# Patient Record
Sex: Female | Born: 1955 | Hispanic: No | Marital: Married | Smoking: Never smoker
Health system: Southern US, Community
[De-identification: ages and names within clinical notes are randomized; demographics above are authoritative.]

## PROBLEM LIST (undated history)

## (undated) DIAGNOSIS — I10 Essential (primary) hypertension: Secondary | ICD-10-CM

## (undated) DIAGNOSIS — E119 Type 2 diabetes mellitus without complications: Secondary | ICD-10-CM

---

## 2016-05-03 ENCOUNTER — Emergency Department (HOSPITAL_BASED_OUTPATIENT_CLINIC_OR_DEPARTMENT_OTHER): Payer: BLUE CROSS/BLUE SHIELD

## 2016-05-03 ENCOUNTER — Encounter (HOSPITAL_BASED_OUTPATIENT_CLINIC_OR_DEPARTMENT_OTHER): Payer: Self-pay

## 2016-05-03 ENCOUNTER — Observation Stay (HOSPITAL_BASED_OUTPATIENT_CLINIC_OR_DEPARTMENT_OTHER)
Admission: EM | Admit: 2016-05-03 | Discharge: 2016-05-05 | Disposition: A | Discharge status: Home / Self Care | Payer: BLUE CROSS/BLUE SHIELD | Attending: Internal Medicine | Admitting: Internal Medicine

## 2016-05-03 DIAGNOSIS — R51 Headache: Secondary | ICD-10-CM

## 2016-05-03 DIAGNOSIS — I10 Essential (primary) hypertension: Secondary | ICD-10-CM | POA: Diagnosis present

## 2016-05-03 DIAGNOSIS — IMO0002 Reserved for concepts with insufficient information to code with codable children: Secondary | ICD-10-CM | POA: Insufficient documentation

## 2016-05-03 DIAGNOSIS — E1165 Type 2 diabetes mellitus with hyperglycemia: Secondary | ICD-10-CM | POA: Insufficient documentation

## 2016-05-03 DIAGNOSIS — Z7984 Long term (current) use of oral hypoglycemic drugs: Secondary | ICD-10-CM | POA: Insufficient documentation

## 2016-05-03 DIAGNOSIS — E785 Hyperlipidemia, unspecified: Secondary | ICD-10-CM | POA: Diagnosis not present

## 2016-05-03 DIAGNOSIS — D509 Iron deficiency anemia, unspecified: Secondary | ICD-10-CM | POA: Insufficient documentation

## 2016-05-03 DIAGNOSIS — D519 Vitamin B12 deficiency anemia, unspecified: Secondary | ICD-10-CM | POA: Diagnosis not present

## 2016-05-03 DIAGNOSIS — N390 Urinary tract infection, site not specified: Secondary | ICD-10-CM

## 2016-05-03 DIAGNOSIS — E1142 Type 2 diabetes mellitus with diabetic polyneuropathy: Secondary | ICD-10-CM | POA: Insufficient documentation

## 2016-05-03 DIAGNOSIS — I160 Hypertensive urgency: Principal | ICD-10-CM | POA: Insufficient documentation

## 2016-05-03 DIAGNOSIS — T502X5A Adverse effect of carbonic-anhydrase inhibitors, benzothiadiazides and other diuretics, initial encounter: Secondary | ICD-10-CM | POA: Insufficient documentation

## 2016-05-03 DIAGNOSIS — R8290 Unspecified abnormal findings in urine: Secondary | ICD-10-CM | POA: Diagnosis present

## 2016-05-03 DIAGNOSIS — R519 Headache, unspecified: Secondary | ICD-10-CM

## 2016-05-03 DIAGNOSIS — I6783 Posterior reversible encephalopathy syndrome: Secondary | ICD-10-CM

## 2016-05-03 DIAGNOSIS — E871 Hypo-osmolality and hyponatremia: Secondary | ICD-10-CM | POA: Diagnosis not present

## 2016-05-03 DIAGNOSIS — Z79899 Other long term (current) drug therapy: Secondary | ICD-10-CM | POA: Insufficient documentation

## 2016-05-03 HISTORY — DX: Type 2 diabetes mellitus without complications: E11.9

## 2016-05-03 HISTORY — DX: Essential (primary) hypertension: I10

## 2016-05-03 MED ORDER — AMLODIPINE BESYLATE 5 MG PO TABS
5.0000 mg | ORAL_TABLET | Freq: Once | ORAL | Status: AC
  Administered 2016-05-03: 5 mg via ORAL
  Filled 2016-05-03: qty 1

## 2016-05-03 NOTE — ED Provider Notes (Signed)
[facility] Provider Note   CSN: [id number] Arrival date & time: 05/03/16  2005  First Provider Contact:   First MD Initiated Contact with Patient 05/03/16 2338      By signing my name below, I, [name], attest that this documentation has been prepared under the direction and in the presence of Andrea Colglazier, MD. Electronically Signed: [name], ED Scribe. 05/03/16. 11:45 PM.    History   Chief Complaint Chief Complaint  Patient presents with  . Hypertension    HPI  Martha Webster is a 60 y.o. female with a  PMHx of HTN and DM, who presents to the Emergency Department complaining of HTN onset 2 days. Pt reports that she was diagnosed with HTN 15 years ago. Pt relative states that the pt is from [place] and she has been in [place] since 6/31/2017 and staying until 06/12/2016. Pt son states that the when the pt is in [place] she is typically given norvasc every couple months when her pressure is elevated. Pt reports that her baseline blood pressure is 130/70. Pt takes ARB/HCTZ and Nebivolol for her HTN.  She states that she is having associated symptoms of HA. She states that she has not tried any medications for the relief for her symptoms. She denies any other symptoms. Pt states that her blood sugar is typically around 130 range. Denies allergies to medications. Pt takes vitamin D and zinc supplement, lipiget, tagipimet. Co-Q 10, and generic version of lyrica daily.   The history is provided by the patient and a relative. A language interpreter was used.  Hypertension  This is a recurrent problem. The current episode started 2 days ago. The problem occurs constantly. The problem has not changed since onset.Associated symptoms include headaches. Pertinent negatives include no chest pain, no abdominal pain and no shortness of breath. Nothing aggravates the symptoms. Nothing relieves the symptoms. She has tried nothing for the symptoms. The treatment provided no relief.     Past Medical History:  Diagnosis Date  . Diabetes mellitus without complication (HCC)   . Hypertension     There are no active problems to display for this patient.   History reviewed. No pertinent surgical history.  OB History    No data available       Home Medications    Prior to Admission medications   Medication Sig Start Date End Date Taking? Authorizing Provider  candesartan-hydrochlorothiazide (ATACAND HCT) 16-12.5 MG tablet Take 1 tablet by mouth daily.   Yes Historical Provider, MD  nebivolol (BYSTOLIC) 5 MG tablet Take 5 mg by mouth daily.   Yes Historical Provider, MD    Family History No family history on file.  Social History Social History  Substance Use Topics  . Smoking status: Never Smoker  . Smokeless tobacco: Never Used  . Alcohol use No     Allergies   Other   Review of Systems Review of Systems  Respiratory: Negative for shortness of breath.   Cardiovascular: Negative for chest pain.  Gastrointestinal: Negative for abdominal pain.  Neurological: Positive for headaches. Negative for tremors, seizures, syncope, facial asymmetry, speech difficulty, weakness, light-headedness and numbness.  All other systems reviewed and are negative.    Physical Exam Updated Vital Signs BP 173/77 (BP Location: Right Arm)   Pulse 84   Temp 98.4 F (36.9 C) (Oral)   Resp 18   Ht 5\' 3"  (1.6 m)   Wt 145 lb 8.1 oz (66 kg)   SpO2 99%  BMI 25.77 kg/m   Physical Exam  Constitutional: She appears well-developed and well-nourished. No distress.  HENT:  Head: Normocephalic and atraumatic.  Mouth/Throat: Oropharynx is clear and moist. No oropharyngeal exudate.  Eyes: Conjunctivae and EOM are normal. Pupils are equal, round, and reactive to light.  Neck: Normal range of motion. Neck supple. Carotid bruit is not present. No tracheal deviation present.  Cardiovascular: Normal rate, regular rhythm and normal heart sounds.  Exam reveals no gallop and no  friction rub.   No murmur heard. Pulmonary/Chest: Effort normal and breath sounds normal. No stridor. No respiratory distress. She has no wheezes. She has no rales.  Abdominal: Soft. Bowel sounds are normal. She exhibits no distension and no mass. There is no tenderness. There is no rebound and no guarding.  Musculoskeletal: Normal range of motion. She exhibits no edema.  Neurological: She is alert. She has normal reflexes. No cranial nerve deficit.  Cranial nerves 2-12 intact.  Skin: Skin is warm and dry. Capillary refill takes less than 2 seconds.  Psychiatric: She has a normal mood and affect. Her behavior is normal.  Nursing note and vitals reviewed.    ED Treatments / Results  DIAGNOSTIC STUDIES: Oxygen Saturation is 99% on RA, nl by my interpretation.    COORDINATION OF CARE: 11:45 PM Discussed treatment plan with pt at bedside which includes UA, labs, CT head, and norvasc and pt agreed to plan.   Labs (all labs ordered are listed, but only abnormal results are displayed) Labs Reviewed  BASIC METABOLIC PANEL  URINALYSIS, ROUTINE W REFLEX MICROSCOPIC (NOT AT [facility])    EKG  EKG Interpretation None       Radiology No results found.  Procedures Procedures (including critical care time)  Medications Ordered in ED Medications  amLODipine (NORVASC) tablet 5 mg (5 mg Oral Given 05/03/16 2359)     Initial Impression / Assessment and Plan / ED Course  I have reviewed the triage vital signs and the nursing notes.  Pertinent labs & imaging results that were available during my care of the patient were reviewed by me and considered in my medical decision making (see chart for details).  Clinical Course   Vitals:   05/04/16 0337 05/04/16 0436  BP: 181/85 162/80  Pulse: 96 90  Resp: 18 22  Temp:  97.9 F (36.6 C)   Results for orders placed or performed during the hospital encounter of 05/03/16  Basic metabolic panel  Result Value Ref Range   Sodium 125 (L) 135 -  145 mmol/L   Potassium 4.5 3.5 - 5.1 mmol/L   Chloride 91 (L) 101 - 111 mmol/L   CO2 24 22 - 32 mmol/L   Glucose, Bld 211 (H) 65 - 99 mg/dL   BUN 11 6 - 20 mg/dL   Creatinine, Ser 1.61 0.44 - 1.00 mg/dL   Calcium 9.4 8.9 - 09.6 mg/dL   GFR calc non Af Amer >60 >60 mL/min   GFR calc Af Amer >60 >60 mL/min   Anion gap 10 5 - 15  Urinalysis, Routine w reflex microscopic (not at [facility])  Result Value Ref Range   Color, Urine YELLOW YELLOW   APPearance CLEAR CLEAR   Specific Gravity, Urine 1.010 1.005 - 1.030   pH 7.5 5.0 - 8.0   Glucose, UA NEGATIVE NEGATIVE mg/dL   Hgb urine dipstick NEGATIVE NEGATIVE   Bilirubin Urine NEGATIVE NEGATIVE   Ketones, ur NEGATIVE NEGATIVE mg/dL   Protein, ur NEGATIVE NEGATIVE mg/dL   Nitrite  NEGATIVE NEGATIVE   Leukocytes, UA LARGE (A) NEGATIVE  Urine microscopic-add on  Result Value Ref Range   Squamous Epithelial / LPF 6-30 (A) NONE SEEN   WBC, UA 6-30 0 - 5 WBC/hpf   RBC / HPF NONE SEEN 0 - 5 RBC/hpf   Bacteria, UA FEW (A) NONE SEEN  POC CBG, ED  Result Value Ref Range   Glucose-Capillary 226 (H) 65 - 99 mg/dL   Ct Head Wo Contrast  Result Date: 05/04/2016 CLINICAL DATA:  Biparietal headache and head pressure. Uncontrolled hypertension for 2 days. EXAM: CT HEAD WITHOUT CONTRAST TECHNIQUE: Contiguous axial images were obtained from the base of the skull through the vertex without intravenous contrast. COMPARISON:  None. FINDINGS: Brain: Questionable low-attenuation in the posterior occipital lobes bilaterally. No intracranial hemorrhage or midline shift. No hydrocephalus. The basilar cisterns are patent. No evidence of territorial infarct. No intracranial fluid collection. Vascular: No hyperdense vessel or abnormal calcification. Skull: Negative for fracture or focal lesion. Calvarium is intact. Sinuses/Orbits: No acute findings. Minimal bubbly debris in the sphenoid sinus. The mastoid air cells are well aerated. Other: None. IMPRESSION: Equivocal  diminished density involving the posterior occipital lobes in a symmetric pattern. Cannot exclude posterior reversible encephalopathy syndrome (PRES) in the setting of hypertension. If clinically warranted this could be further evaluated with MRI. No hemorrhage.  There is otherwise no acute abnormality. Electronically Signed   By: Rubye Oaks M.D.   On: 05/04/2016 01:29   Medications  0.9 %  sodium chloride infusion (1,000 mLs Intravenous New Bag/Given 05/04/16 0415)  amLODipine (NORVASC) tablet 5 mg (5 mg Oral Given 05/03/16 2359)  cefTRIAXone (ROCEPHIN) 1 g in dextrose 5 % 50 mL IVPB (0 g Intravenous Stopped 05/04/16 0435)     Final Clinical Impressions(s) / ED Diagnoses   Final diagnoses:  None    New Prescriptions New Prescriptions   No medications on file  case d/w Dr. Roseanne Reno admit to medicine for work up Tele obs per Dr. Toniann Fail I personally performed the services described in this documentation, which was scribed in my presence. The recorded information has been reviewed and is accurate.       Cy Blamer, MD 05/04/16 [phone]

## 2016-05-03 NOTE — ED Triage Notes (Signed)
Per son pt with high blood pressure, HA x 2 days-pt 's son interpreting-NAD-steady gait

## 2016-05-04 ENCOUNTER — Observation Stay (HOSPITAL_BASED_OUTPATIENT_CLINIC_OR_DEPARTMENT_OTHER): Payer: BLUE CROSS/BLUE SHIELD

## 2016-05-04 ENCOUNTER — Observation Stay (HOSPITAL_COMMUNITY): Payer: BLUE CROSS/BLUE SHIELD

## 2016-05-04 ENCOUNTER — Encounter (HOSPITAL_BASED_OUTPATIENT_CLINIC_OR_DEPARTMENT_OTHER): Payer: Self-pay | Admitting: Emergency Medicine

## 2016-05-04 DIAGNOSIS — I10 Essential (primary) hypertension: Secondary | ICD-10-CM | POA: Diagnosis present

## 2016-05-04 DIAGNOSIS — R519 Headache, unspecified: Secondary | ICD-10-CM

## 2016-05-04 DIAGNOSIS — R51 Headache: Secondary | ICD-10-CM

## 2016-05-04 DIAGNOSIS — E1142 Type 2 diabetes mellitus with diabetic polyneuropathy: Secondary | ICD-10-CM | POA: Diagnosis present

## 2016-05-04 DIAGNOSIS — R8290 Unspecified abnormal findings in urine: Secondary | ICD-10-CM | POA: Diagnosis present

## 2016-05-04 DIAGNOSIS — N390 Urinary tract infection, site not specified: Secondary | ICD-10-CM

## 2016-05-04 DIAGNOSIS — E871 Hypo-osmolality and hyponatremia: Secondary | ICD-10-CM | POA: Diagnosis present

## 2016-05-04 DIAGNOSIS — IMO0002 Reserved for concepts with insufficient information to code with codable children: Secondary | ICD-10-CM | POA: Insufficient documentation

## 2016-05-04 DIAGNOSIS — E1165 Type 2 diabetes mellitus with hyperglycemia: Secondary | ICD-10-CM | POA: Insufficient documentation

## 2016-05-04 DIAGNOSIS — E785 Hyperlipidemia, unspecified: Secondary | ICD-10-CM | POA: Diagnosis present

## 2016-05-04 LAB — ECHOCARDIOGRAM COMPLETE
E/e' ratio: 9.59
FS: 42 % (ref 28–44)
Height: 63 in
IV/PV OW: 0.88
LA diam index: 1.95 cm/m2
LA vol A4C: 25.7 ml
LASIZE: 33 mm
LAVOL: 33.6 mL
LAVOLIN: 19.9 mL/m2
LEFT ATRIUM END SYS DIAM: 33 mm
LV PW d: 9.04 mm — AB (ref 0.6–1.1)
LVEEAVG: 9.59
LVEEMED: 9.59
LVELAT: 8.49 cm/s
LVOT area: 2.01 cm2
LVOTD: 16 mm
Lateral S' vel: 12 cm/s
MV Peak grad: 3 mmHg
MVPKAVEL: 112 m/s
MVPKEVEL: 81.4 m/s
TAPSE: 17.3 mm
TDI e' lateral: 8.49
TDI e' medial: 8.16
Weight: 2328.06 oz

## 2016-05-04 LAB — CBC WITH DIFFERENTIAL/PLATELET
BASOS PCT: 0 %
Basophils Absolute: 0 10*3/uL (ref 0.0–0.1)
EOS ABS: 0.1 10*3/uL (ref 0.0–0.7)
Eosinophils Relative: 1 %
HCT: 31.8 % — ABNORMAL LOW (ref 36.0–46.0)
HEMOGLOBIN: 10.8 g/dL — AB (ref 12.0–15.0)
Lymphocytes Relative: 26 %
Lymphs Abs: 2.6 10*3/uL (ref 0.7–4.0)
MCH: 25.2 pg — AB (ref 26.0–34.0)
MCHC: 34 g/dL (ref 30.0–36.0)
MCV: 74.1 fL — ABNORMAL LOW (ref 78.0–100.0)
MONO ABS: 0.4 10*3/uL (ref 0.1–1.0)
Monocytes Relative: 4 %
NEUTROS ABS: 6.8 10*3/uL (ref 1.7–7.7)
Neutrophils Relative %: 69 %
PLATELETS: 387 10*3/uL (ref 150–400)
RBC: 4.29 MIL/uL (ref 3.87–5.11)
RDW: 14.1 % (ref 11.5–15.5)
WBC: 9.9 10*3/uL (ref 4.0–10.5)

## 2016-05-04 LAB — GLUCOSE, CAPILLARY
GLUCOSE-CAPILLARY: 316 mg/dL — AB (ref 65–99)
Glucose-Capillary: 186 mg/dL — ABNORMAL HIGH (ref 65–99)
Glucose-Capillary: 134 mg/dL — ABNORMAL HIGH (ref 65–99)
Glucose-Capillary: 318 mg/dL — ABNORMAL HIGH (ref 65–99)
Glucose-Capillary: 311 mg/dL — ABNORMAL HIGH (ref 65–99)

## 2016-05-04 LAB — BASIC METABOLIC PANEL
Anion gap: 10 (ref 5–15)
Anion gap: 10 (ref 5–15)
BUN: 11 mg/dL (ref 6–20)
BUN: 9 mg/dL (ref 6–20)
CHLORIDE: 101 mmol/L (ref 101–111)
CHLORIDE: 91 mmol/L — AB (ref 101–111)
CO2: 21 mmol/L — ABNORMAL LOW (ref 22–32)
CO2: 24 mmol/L (ref 22–32)
CREATININE: 0.63 mg/dL (ref 0.44–1.00)
CREATININE: 0.66 mg/dL (ref 0.44–1.00)
Calcium: 9.4 mg/dL (ref 8.9–10.3)
Calcium: 9.5 mg/dL (ref 8.9–10.3)
GFR calc Af Amer: >60 mL/min (ref >60)
GFR calc Af Amer: >60 mL/min (ref >60)
GFR calc non Af Amer: >60 mL/min (ref >60)
GFR calc non Af Amer: >60 mL/min (ref >60)
GLUCOSE: 211 mg/dL — AB (ref 65–99)
Glucose, Bld: 297 mg/dL — ABNORMAL HIGH (ref 65–99)
POTASSIUM: 4.5 mmol/L (ref 3.5–5.1)
Potassium: 4.3 mmol/L (ref 3.5–5.1)
SODIUM: 125 mmol/L — AB (ref 135–145)
SODIUM: 132 mmol/L — AB (ref 135–145)

## 2016-05-04 LAB — SODIUM, URINE, RANDOM: SODIUM UR: 32 mmol/L

## 2016-05-04 LAB — CBG MONITORING, ED: GLUCOSE-CAPILLARY: 226 mg/dL — AB (ref 65–99)

## 2016-05-04 LAB — URINALYSIS, ROUTINE W REFLEX MICROSCOPIC
BILIRUBIN URINE: NEGATIVE
GLUCOSE, UA: NEGATIVE mg/dL
HGB URINE DIPSTICK: NEGATIVE
Ketones, ur: NEGATIVE mg/dL
Nitrite: NEGATIVE
PH: 7.5 (ref 5.0–8.0)
Protein, ur: NEGATIVE mg/dL
SPECIFIC GRAVITY, URINE: 1.01 (ref 1.005–1.030)

## 2016-05-04 LAB — URINE MICROSCOPIC-ADD ON: RBC / HPF: NONE SEEN RBC/hpf (ref 0–5)

## 2016-05-04 LAB — OSMOLALITY: OSMOLALITY: 277 mosm/kg (ref 275–295)

## 2016-05-04 LAB — OSMOLALITY, URINE: Osmolality, Ur: 114 mOsm/kg — ABNORMAL LOW (ref 300–900)

## 2016-05-04 MED ORDER — IRBESARTAN 300 MG PO TABS
300.0000 mg | ORAL_TABLET | Freq: Every day | ORAL | Status: DC
  Administered 2016-05-04 – 2016-05-05 (×2): 300 mg via ORAL
  Filled 2016-05-04 (×2): qty 1

## 2016-05-04 MED ORDER — NEBIVOLOL HCL 2.5 MG PO TABS
5.0000 mg | ORAL_TABLET | Freq: Every day | ORAL | Status: DC
  Administered 2016-05-04 – 2016-05-05 (×2): 5 mg via ORAL
  Filled 2016-05-04 (×2): qty 2

## 2016-05-04 MED ORDER — ATORVASTATIN CALCIUM 10 MG PO TABS
10.0000 mg | ORAL_TABLET | Freq: Every day | ORAL | Status: DC
  Administered 2016-05-04: 10 mg via ORAL
  Filled 2016-05-04: qty 1

## 2016-05-04 MED ORDER — LINAGLIPTIN 5 MG PO TABS
5.0000 mg | ORAL_TABLET | Freq: Every day | ORAL | Status: DC
  Administered 2016-05-04 – 2016-05-05 (×2): 5 mg via ORAL
  Filled 2016-05-04 (×2): qty 1

## 2016-05-04 MED ORDER — GLIMEPIRIDE 2 MG PO TABS
2.0000 mg | ORAL_TABLET | Freq: Every day | ORAL | Status: DC
  Administered 2016-05-04 – 2016-05-05 (×2): 2 mg via ORAL
  Filled 2016-05-04 (×2): qty 1

## 2016-05-04 MED ORDER — EZETIMIBE 10 MG PO TABS
10.0000 mg | ORAL_TABLET | Freq: Every day | ORAL | Status: DC
  Administered 2016-05-04 – 2016-05-05 (×2): 10 mg via ORAL
  Filled 2016-05-04 (×2): qty 1

## 2016-05-04 MED ORDER — SODIUM CHLORIDE 0.9 % IV SOLN
INTRAVENOUS | Status: AC
  Administered 2016-05-04: 1000 mL via INTRAVENOUS

## 2016-05-04 MED ORDER — SODIUM CHLORIDE 0.9% FLUSH
3.0000 mL | Freq: Two times a day (BID) | INTRAVENOUS | Status: DC
  Administered 2016-05-05: 3 mL via INTRAVENOUS

## 2016-05-04 MED ORDER — ENOXAPARIN SODIUM 40 MG/0.4ML ~~LOC~~ SOLN
40.0000 mg | SUBCUTANEOUS | Status: DC
  Administered 2016-05-04 – 2016-05-05 (×2): 40 mg via SUBCUTANEOUS
  Filled 2016-05-04 (×2): qty 0.4

## 2016-05-04 MED ORDER — ACETAMINOPHEN 325 MG PO TABS: 650.0000 mg | ORAL_TABLET | Freq: Four times a day (QID) | ORAL | Status: DC | PRN

## 2016-05-04 MED ORDER — DEXAMETHASONE 4 MG PO TABS
10.0000 mg | ORAL_TABLET | Freq: Once | ORAL | Status: AC
  Administered 2016-05-04: 10 mg via ORAL
  Filled 2016-05-04: qty 1

## 2016-05-04 MED ORDER — PREGABALIN 25 MG PO CAPS
50.0000 mg | ORAL_CAPSULE | Freq: Every day | ORAL | Status: DC
  Administered 2016-05-04 – 2016-05-05 (×2): 50 mg via ORAL
  Filled 2016-05-04 (×2): qty 2

## 2016-05-04 MED ORDER — GADOBENATE DIMEGLUMINE 529 MG/ML IV SOLN
15.0000 mL | Freq: Once | INTRAVENOUS | Status: AC | PRN
  Administered 2016-05-04: 14 mL via INTRAVENOUS

## 2016-05-04 MED ORDER — ACETAMINOPHEN 650 MG RE SUPP: 650.0000 mg | Freq: Four times a day (QID) | RECTAL | Status: DC | PRN

## 2016-05-04 MED ORDER — SODIUM CHLORIDE 0.9 % IV SOLN
INTRAVENOUS | Status: DC
  Administered 2016-05-04 (×2): 1000 mL via INTRAVENOUS

## 2016-05-04 MED ORDER — INSULIN ASPART 100 UNIT/ML ~~LOC~~ SOLN
0.0000 [IU] | Freq: Every day | SUBCUTANEOUS | Status: DC
  Administered 2016-05-04: 4 [IU] via SUBCUTANEOUS

## 2016-05-04 MED ORDER — DEXTROSE 5 % IV SOLN
1.0000 g | Freq: Once | INTRAVENOUS | Status: AC
  Administered 2016-05-04: 1 g via INTRAVENOUS
  Filled 2016-05-04: qty 10

## 2016-05-04 MED ORDER — INSULIN ASPART 100 UNIT/ML ~~LOC~~ SOLN
0.0000 [IU] | Freq: Three times a day (TID) | SUBCUTANEOUS | Status: DC
  Administered 2016-05-04 (×2): 7 [IU] via SUBCUTANEOUS
  Administered 2016-05-04: 1 [IU] via SUBCUTANEOUS
  Administered 2016-05-05: 3 [IU] via SUBCUTANEOUS
  Administered 2016-05-05: 2 [IU] via SUBCUTANEOUS

## 2016-05-04 NOTE — Progress Notes (Signed)
Received report from Vinson MoselleSam Coble, Rn[place] [facility].  Cherian, Erline LevineGhulam is a 60 year old [place] women who is currently residing [place].  Reportedly, per her son, Patient has complained of a headache X 2 days and went to Urgent care.  Urgent Care took her blood pressure and it was 200/82.  Urgent Care then sent  Her to the ED.   She has a history of DM and HTN.  She has a 20gauge IV in her L wrist. CT stated "cannot exclude Posterior reversible encephelopathy syndrome in the setting of HTN. (PRES).  Patient transported from [facility] via [facility] and arrived at 0650 am.  Paged admission and informed them of the arrival. Patient does not speak English, and all information obtained was by her son. They are Muslim and observe prayer times.  Sign made for the door not to disturb during prayer.

## 2016-05-04 NOTE — ED Notes (Signed)
Called to give report  rn unable to come to phone  Will call med center back

## 2016-05-04 NOTE — Care Management Note (Signed)
Case Management Note  Patient Details  Name: Martha Webster MRN: [id number] Date of Birth: January 03, 1956  Subjective/Objective:                 Independent patient from home, in Obs, hyponatremia. Visiting until September from [place]. Staying with son and daugter in law. Spoke with daugter in law in room. No CM needs at this time.  Action/Plan:  Will DC to home in care of son and daughter in law.   Expected Discharge Date:                  Expected Discharge Plan:  Home/Self Care  In-House Referral:  NA  Discharge planning Services  CM Consult  Post Acute Care Choice:  NA Choice offered to:  NA  DME Arranged:  N/A DME Agency:  NA  HH Arranged:  NA HH Agency:  NA  Status of Service:     If discussed at Long Length of Stay Meetings, dates discussed:    Additional Comments:  Martha SabalDebbie Madlyn Crosby, RN 05/04/2016, 2:39 PM

## 2016-05-04 NOTE — Progress Notes (Addendum)
CBC w/o leukocytosis so less likely abn UA c/w UTI but follow up on urine cx. Pt has microcytic anemia and was not on iron preadmit so will check anemia panel and TSH in am  Clarification: She was also continued on her Bystolic at time of admission  No evidence of PRES on MRI. Incidental finding of C3-4 bulge/protrusion with slight flattening of the cord.   Serum osmo and urine sodium are normal with low urine osmo c/w recent diuretic use (labs obtained AFTER pt received NS IVFs in ER)  Junious SilkAllison Gabrian Hoque, ANP

## 2016-05-04 NOTE — H&P (Signed)
History and Physical    Martha Webster ZOX:[id number] DOB: May 18, 1956 DOA: 05/03/2016   PCP: No primary care provider on file. UNASSIGNED  Patient coming from/Resides with: Private residence/lives with family-primary citizenship in [place] but also resides for long periods of time in the [place] speaks Nurse, mental health Complaint: Headache presumed related to uncontrolled blood pressure  HPI: Martha Webster is a 60 y.o. female with medical history significant for hypertension, diabetes on oral medications, dyslipidemia and diabetic peripheral neuropathy. Patient has a history of experiencing significant headaches when her blood pressure is uncontrolled and according to her son at the bedside typically she will seek medical care and be given "a medication under her tongue" and asked her dose of her usual medicine and then her blood pressure gets better and her headache "goes away". Because the patient does not speak English her son was at the bedside serving as Nurse, learning disability. Patient's home medications were in a bag at the bedside and I reviewed these as well during the history. According to the son patient was "okay" until Sunday about 5 PM when she began having one of these headaches that she presumed was related to blood pressure being poorly controlled. She took her usual medications without relief. Son reports no mental status changes or focal neurological deficits. She has not had any overt dietary indiscretion. The patient was initially seen at [facility] ED and subsequently transferred to [facility] for admission.  ED Course:  Vital signs: 98.4-180/83-90 -18-RA 99% Follow-up vital signs: 98.4-151/67-88-18 CT head without contrast: Equivocal diminished density in involving the posterior occipital lobes in a symmetric pattern. Cannot exclude posterior reversible encephalopathy syndrome in the setting of hypertension Lab data: Sodium 125, potassium 4.5, chloride 91, BUN 11, creatinine 0.63,  glucose 211, anion gap 10, urinalysis with few bacteria, large leukocytes, 6-30 squamous epithelials and 6-30 WBCs Medications and treatments: Norvasc 5 mg by mouth 1, Rocephin 1 g IV 1, normal saline IV fluid at 125 mL per hour  Review of Systems:  In addition to the HPI above,  No Fever-chills, myalgias or other constitutional symptoms No changes with Vision or hearing, new weakness, tingling, numbness in any extremity, No problems swallowing food or Liquids, indigestion/reflux No Chest pain, Cough or Shortness of Breath, palpitations, orthopnea or DOE No Abdominal pain, N/V; no melena or hematochezia, no dark tarry stools No dysuria, hematuria or flank pain No new skin rashes, lesions, masses or bruises, No new joints pains-aches No recent weight gain or loss No polyuria, polydypsia or polyphagia,   Past Medical History:  Diagnosis Date  . Diabetes mellitus without complication (HCC)   . Hypertension Dyslipidemia  Diabetic peripheral neuropathy      History reviewed. No pertinent surgical history.  Social History   Social History  . Marital status: Married    Spouse name: N/A  . Number of children: N/A  . Years of education: N/A   Occupational History  . Not on file.   Social History Main Topics  . Smoking status: Never Smoker  . Smokeless tobacco: Never Used  . Alcohol use No  . Drug use: No  . Sexual activity: Not on file   Other Topics Concern  . Not on file   Social History Narrative  . No narrative on file    Mobility: Without assistive devices Work history: Not obtained   Allergies  Allergen Reactions  . Other     A sleep med    Family history reviewed and not pertinent to current  admission diagnosis and complaints  Prior to Admission medications   Medication Sig Start Date End Date Taking? Authorizing Provider  candesartan-hydrochlorothiazide (ATACAND HCT) 16-12.5 MG tablet Take 1 tablet by mouth daily.   Yes Historical Provider, MD    nebivolol (BYSTOLIC) 5 MG tablet Atorvastatin ezetimibe 10/10 Amaryl/metformin 2 mg/500 mg  Pregabalin 50 mg  Sitagliptin/metformin 50/500  Take 5 mg by mouth daily. Take 1 tablet daily   Take 1 tablet by mouth daily  Take 1 daily  Take 1 tablet daily    Yes Historical Provider, MD    Physical Exam: Vitals:   05/04/16 0057 05/04/16 0337 05/04/16 0436 05/04/16 0609  BP: 179/85 181/85 162/80 (!) 151/67  Pulse: 92 96 90 88  Resp: Temp:   97.9 F (36.6 C) 98.4 F (36.9 C)  TempSrc:   Oral Oral  SpO2: 99% 100% 98% 99%  Weight:      Height:          Constitutional: NAD, calm, seems uncomfortable when awake-noted to be grimacing and reports headache to son Eyes: PERRL, lids and conjunctivae normal ENMT: Mucous membranes are moist. Posterior pharynx clear of any exudate or lesions.Normal dentition.  Neck: normal, supple, no masses, no thyromegaly Respiratory: clear to auscultation bilaterally, no wheezing, no crackles. Normal respiratory effort. No accessory muscle use.  Cardiovascular: Regular rate and rhythm, no murmurs / rubs / gallops. No extremity edema. 2+ pedal pulses. No carotid bruits.  Abdomen: no tenderness, no masses palpated. No hepatosplenomegaly. Bowel sounds positive.  Musculoskeletal: no clubbing / cyanosis. No joint deformity upper and lower extremities. Good ROM, no contractures. Normal muscle tone.  Skin: no rashes, lesions, ulcers. No induration Neurologic: CN 2-12 grossly intact. Sensation intact, DTR normal. Strength 5/5 x all 4 extremities.  Psychiatric: Exam limited by language barrier but with assistance of son patient appears to be appropriate, awakens easily and appears to be oriented 3   Labs on Admission: I have personally reviewed following labs and imaging studies  CBC: No results for input(s): WBC, NEUTROABS, HGB, HCT, MCV, PLT in the last 168 hours. Basic Metabolic Panel:  Recent Labs Lab 05/04/16 0008  NA 125*  K 4.5  CL  91*  CO2 24  GLUCOSE 211*  BUN 11  CREATININE 0.63  CALCIUM 9.4   GFR: Estimated Creatinine Clearance: 69.1 mL/min (by C-G formula based on SCr of 0.8 mg/dL). Liver Function Tests: No results for input(s): AST, ALT, ALKPHOS, BILITOT, PROT, ALBUMIN in the last 168 hours. No results for input(s): LIPASE, AMYLASE in the last 168 hours. No results for input(s): AMMONIA in the last 168 hours. Coagulation Profile: No results for input(s): INR, PROTIME in the last 168 hours. Cardiac Enzymes: No results for input(s): CKTOTAL, CKMB, CKMBINDEX, TROPONINI in the last 168 hours. BNP (last 3 results) No results for input(s): PROBNP in the last 8760 hours. HbA1C: No results for input(s): HGBA1C in the last 72 hours. CBG:  Recent Labs Lab 05/04/16 0434  GLUCAP 226*   Lipid Profile: No results for input(s): CHOL, HDL, LDLCALC, TRIG, CHOLHDL, LDLDIRECT in the last 72 hours. Thyroid Function Tests: No results for input(s): TSH, T4TOTAL, FREET4, T3FREE, THYROIDAB in the last 72 hours. Anemia Panel: No results for input(s): VITAMINB12, FOLATE, FERRITIN, TIBC, IRON, RETICCTPCT in the last 72 hours. Urine analysis:    Component Value Date/Time   COLORURINE YELLOW 05/04/2016 0009   APPEARANCEUR CLEAR 05/04/2016 0009   LABSPEC 1.010 05/04/2016 0009   PHURINE 7.5 05/04/2016  0009   GLUCOSEU NEGATIVE 05/04/2016 0009   HGBUR NEGATIVE 05/04/2016 0009   BILIRUBINUR NEGATIVE 05/04/2016 0009   KETONESUR NEGATIVE 05/04/2016 0009   PROTEINUR NEGATIVE 05/04/2016 0009   NITRITE NEGATIVE 05/04/2016 0009   LEUKOCYTESUR LARGE (A) 05/04/2016 0009   Sepsis Labs: @LABRCNTIP (procalcitonin:4,lacticidven:4) )No results found for this or any previous visit (from the past 240 hour(s)).   Radiological Exams on Admission: Ct Head Wo Contrast  Result Date: 05/04/2016 CLINICAL DATA:  Biparietal headache and head pressure. Uncontrolled hypertension for 2 days. EXAM: CT HEAD WITHOUT CONTRAST TECHNIQUE: Contiguous  axial images were obtained from the base of the skull through the vertex without intravenous contrast. COMPARISON:  None. FINDINGS: Brain: Questionable low-attenuation in the posterior occipital lobes bilaterally. No intracranial hemorrhage or midline shift. No hydrocephalus. The basilar cisterns are patent. No evidence of territorial infarct. No intracranial fluid collection. Vascular: No hyperdense vessel or abnormal calcification. Skull: Negative for fracture or focal lesion. Calvarium is intact. Sinuses/Orbits: No acute findings. Minimal bubbly debris in the sphenoid sinus. The mastoid air cells are well aerated. Other: None. IMPRESSION: Equivocal diminished density involving the posterior occipital lobes in a symmetric pattern. Cannot exclude posterior reversible encephalopathy syndrome (PRES) in the setting of hypertension. If clinically warranted this could be further evaluated with MRI. No hemorrhage.  There is otherwise no acute abnormality. Electronically Signed   By: Rubye Oaks M.D.   On: 05/04/2016 01:29    Assessment/Plan Principal Problem:   Uncontrolled hypertension -Patient presents with uncontrolled headache which is reported to be typical for her with uncontrolled blood pressure; CT head questioning possible PRES therefore reason for admission -Neuro hospitalist consultation pending -MRI with contrast -Focus on blood pressure control by resuming home medications: Candesartan, but hold hydrochlorothiazide setting of acute hyponatremia -Current blood pressure not well controlled but better than presenting blood pressure -Echocardiogram-son reports has not had an at least 5-6 years -Son reports no history of edema so patient may be able to tolerate discontinuation of thiazide diuretic if indicated -No evidence of encephalopathy or focal neurological deficits -neuro checks every 4 hours  Active Problems:   Acute hyponatremia  -Baseline sodium unknown -Check SIADH labs: Urine and  serum osmolality and random urine sodium -Suspect thiazide diuretic is the culprit -Given IV fluids in the ER and will continue saline but decrease rate to 50 mL per hour for an additional 4 hours only -Repeat electrolyte panel at 4 PM today and again in morning    Abnormal urinalysis -Possibly related to UTI -Patient is afebrile and apparently asymptomatic -Check urine culture and CBC -Was given Rocephin in the ER earlier this morning -will not continue antibiotics at this juncture    Diabetes mellitus type 2, uncontrolled  -Current CBG greater than 200 -Patient takes Amaryl, metformin and Januvia at home -Hold metformin acutely-I have ordered imaging studies with IV contrast  -Follow CBGs and provide SSI -Check hemoglobin A1c    Diabetic peripheral neuropathy  -Continue preadmission Lyrica    HLD (hyperlipidemia) -Continue preadmission atorvastatin and ezetimibe -Fasting lipid panel in a.m.      DVT prophylaxis: Lovenox  Code Status: Full Family Communication: Patient son at bedside who is also assisting with translation  Disposition Plan: Anticipate discharge back to preadmission home environment once medically stable Consults called: ER physician Dr. Nicanor Alcon documented she spoke with neuro hospitalist Dr. Roseanne Reno  Admission status: Up survey she/telemetry    Russella Dar ANP-BC [facility] Pager [phone]   If 7PM-7AM, please contact night-coverage [email or web address] Password  [email or web address]  05/04/2016, 8:29 AM

## 2016-05-04 NOTE — Progress Notes (Signed)
  Echocardiogram 2D Echocardiogram has been performed.  Delcie RochENNINGTON, Aneli Zara 05/04/2016, 3:54 PM

## 2016-05-05 DIAGNOSIS — R8290 Unspecified abnormal findings in urine: Secondary | ICD-10-CM | POA: Diagnosis not present

## 2016-05-05 DIAGNOSIS — I10 Essential (primary) hypertension: Secondary | ICD-10-CM

## 2016-05-05 DIAGNOSIS — E1165 Type 2 diabetes mellitus with hyperglycemia: Secondary | ICD-10-CM | POA: Diagnosis not present

## 2016-05-05 DIAGNOSIS — E118 Type 2 diabetes mellitus with unspecified complications: Secondary | ICD-10-CM | POA: Diagnosis not present

## 2016-05-05 LAB — BASIC METABOLIC PANEL
ANION GAP: 9 (ref 5–15)
BUN: 10 mg/dL (ref 6–20)
CALCIUM: 9.3 mg/dL (ref 8.9–10.3)
CO2: 22 mmol/L (ref 22–32)
Chloride: 103 mmol/L (ref 101–111)
Creatinine, Ser: 0.61 mg/dL (ref 0.44–1.00)
Glucose, Bld: 197 mg/dL — ABNORMAL HIGH (ref 65–99)
Potassium: 4.1 mmol/L (ref 3.5–5.1)
Sodium: 134 mmol/L — ABNORMAL LOW (ref 135–145)

## 2016-05-05 LAB — FERRITIN: FERRITIN: 9 ng/mL — AB (ref 11–307)

## 2016-05-05 LAB — GLUCOSE, CAPILLARY
GLUCOSE-CAPILLARY: 171 mg/dL — AB (ref 65–99)
Glucose-Capillary: 223 mg/dL — ABNORMAL HIGH (ref 65–99)

## 2016-05-05 LAB — VITAMIN B12: Vitamin B-12: 260 pg/mL (ref 180–914)

## 2016-05-05 LAB — HEMOGLOBIN A1C
Hgb A1c MFr Bld: 7.9 % — ABNORMAL HIGH (ref 4.8–5.6)
MEAN PLASMA GLUCOSE: 180 mg/dL

## 2016-05-05 LAB — LIPID PANEL
Cholesterol: 150 mg/dL (ref 0–200)
HDL: 45 mg/dL (ref >40)
LDL CALC: 74 mg/dL (ref 0–99)
Total CHOL/HDL Ratio: 3.3 RATIO
Triglycerides: 155 mg/dL — ABNORMAL HIGH (ref <150)
VLDL: 31 mg/dL (ref 0–40)

## 2016-05-05 LAB — RETICULOCYTES
RBC.: 4.38 MIL/uL (ref 3.87–5.11)
RETIC COUNT ABSOLUTE: 48.2 10*3/uL (ref 19.0–186.0)
Retic Ct Pct: 1.1 % (ref 0.4–3.1)

## 2016-05-05 LAB — TSH: TSH: 0.445 u[IU]/mL (ref 0.350–4.500)

## 2016-05-05 LAB — URINE CULTURE: Culture: NO GROWTH

## 2016-05-05 LAB — IRON AND TIBC
IRON: 24 ug/dL — AB (ref 28–170)
Saturation Ratios: 5 % — ABNORMAL LOW (ref 10.4–31.8)
TIBC: 452 ug/dL — ABNORMAL HIGH (ref 250–450)
UIBC: 428 ug/dL

## 2016-05-05 LAB — FOLATE: FOLATE: 23.3 ng/mL (ref >5.9)

## 2016-05-05 MED ORDER — FERROUS SULFATE 325 (65 FE) MG PO TABS: 325.0000 mg | ORAL_TABLET | Freq: Every day | ORAL | 3 refills | Status: AC

## 2016-05-05 MED ORDER — VITAMIN B-12 100 MCG PO TABS
100.0000 ug | ORAL_TABLET | Freq: Every day | ORAL | Status: DC
  Administered 2016-05-05: 100 ug via ORAL
  Filled 2016-05-05: qty 1

## 2016-05-05 MED ORDER — CYANOCOBALAMIN 100 MCG PO TABS: 100.0000 ug | ORAL_TABLET | Freq: Every day | ORAL | 0 refills | Status: AC

## 2016-05-05 MED ORDER — AMLODIPINE BESYLATE 5 MG PO TABS: 5.0000 mg | ORAL_TABLET | Freq: Every day | ORAL | 0 refills | Status: AC

## 2016-05-05 MED ORDER — IRBESARTAN 300 MG PO TABS: 300.0000 mg | ORAL_TABLET | Freq: Every day | ORAL | 0 refills | Status: AC

## 2016-05-05 MED ORDER — AMLODIPINE BESYLATE 5 MG PO TABS
5.0000 mg | ORAL_TABLET | Freq: Every day | ORAL | Status: DC
  Administered 2016-05-05: 5 mg via ORAL
  Filled 2016-05-05: qty 1

## 2016-05-05 MED ORDER — CYANOCOBALAMIN 1000 MCG/ML IJ SOLN
1000.0000 ug | Freq: Once | INTRAMUSCULAR | Status: AC
  Administered 2016-05-05: 1000 ug via INTRAMUSCULAR
  Filled 2016-05-05: qty 1

## 2016-05-05 MED ORDER — SITAGLIPTIN PHOS-METFORMIN HCL 50-1000 MG PO TABS: 1.0000 | ORAL_TABLET | Freq: Two times a day (BID) | ORAL | 1 refills | Status: AC

## 2016-05-05 NOTE — Discharge Instructions (Signed)
Follow with Primary MD in 7 days , information was given to the family about speaking female physician in that area.  Get CBC, CMP,  checked  by Primary MD next visit.    Activity: As tolerated with Full fall precautions use walker/cane & assistance as needed   Disposition Home    Diet: Heart Healthy , low-salt, carbohydrate modified. , with feeding assistance and aspiration precautions.  For Heart failure patients - Check your Weight same time everyday, if you gain over 2 pounds, or you develop in leg swelling, experience more shortness of breath or chest pain, call your Primary MD immediately. Follow Cardiac Low Salt Diet and 1.5 lit/day fluid restriction.   On your next visit with your primary care physician please Get Medicines reviewed and adjusted.   Please request your Prim.MD to go over all Hospital Tests and Procedure/Radiological results at the follow up, please get all Hospital records sent to your Prim MD by signing hospital release before you go home.   If you experience worsening of your admission symptoms, develop shortness of breath, life threatening emergency, suicidal or homicidal thoughts you must seek medical attention immediately by calling 911 or calling your MD immediately  if symptoms less severe.  You Must read complete instructions/literature along with all the possible adverse reactions/side effects for all the Medicines you take and that have been prescribed to you. Take any new Medicines after you have completely understood and accpet all the possible adverse reactions/side effects.   Do not drive, operating heavy machinery, perform activities at heights, swimming or participation in water activities or provide baby sitting services if your were admitted for syncope or siezures until you have seen by Primary MD or a Neurologist and advised to do so again.  Do not drive when taking Pain medications.    Do not take more than prescribed Pain, Sleep and Anxiety  Medications  Special Instructions: If you have smoked or chewed Tobacco  in the last 2 yrs please stop smoking, stop any regular Alcohol  and or any Recreational drug use.  Wear Seat belts while driving.   Please note  You were cared for by a hospitalist during your hospital stay. If you have any questions about your discharge medications or the care you received while you were in the hospital after you are discharged, you can call the unit and asked to speak with the hospitalist on call if the hospitalist that took care of you is not available. Once you are discharged, your primary care physician will handle any further medical issues. Please note that NO REFILLS for any discharge medications will be authorized once you are discharged, as it is imperative that you return to your primary care physician (or establish a relationship with a primary care physician if you do not have one) for your aftercare needs so that they can reassess your need for medications and monitor your lab values.

## 2016-05-05 NOTE — Discharge Summary (Signed)
Martha Webster, is a 60 y.o. female  DOB [birth year]  MRN [id number].  Admission date:  05/03/2016  Admitting Physician  Eduard Clos, MD  Discharge Date:  05/05/2016   Primary MD  No primary care provider on file.  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 Diabetes mellitus without complication (HCC)   . Hypertension     History reviewed. No pertinent surgical history.     History of present illness and  Hospital Course:     Kindly see H&P for history of present illness and admission details, please review complete Labs, Consult reports and Test reports for all details in brief  HPI  from the history and physical done on the day of admission 05/04/2016  HPI: Martha Webster is a 60 y.o. female with medical history significant for hypertension, diabetes on oral medications, dyslipidemia and diabetic peripheral neuropathy. Patient has a history of experiencing significant headaches when her blood pressure is uncontrolled and according to her son at the  bedside typically she will seek medical care and be given "a medication under her tongue" and asked her dose of her usual medicine and then her blood pressure gets better and her headache "goes away". Because the patient does not speak English her son was at the bedside serving as Nurse, learning disability. Patient's home medications were in a bag at the bedside and I reviewed these as well during the history. According to the son patient was "okay" until Sunday about 5 PM when she began having one of these headaches that she presumed was related to blood pressure being poorly controlled. She took her usual medications without relief. Son reports no mental status changes or focal neurological deficits. She has not had any overt dietary indiscretion. The patient was initially seen at [facility] ED and subsequently transferred to [facility] for admission.  ED Course:  Vital signs: 98.4-180/83-90 -18-RA 99% Follow-up vital signs: 98.4-151/67-88-18 CT head without contrast: Equivocal diminished density in involving the posterior occipital lobes in a symmetric pattern. Cannot exclude posterior reversible encephalopathy syndrome in the setting of hypertension Lab data: Sodium 125, potassium 4.5, chloride 91, BUN 11, creatinine 0.63, glucose 211, anion gap 10, urinalysis with few bacteria, large leukocytes, 6-30 squamous epithelials and 6-30 WBCs Medications and treatments: Norvasc 5 mg by mouth  1, Rocephin 1 g IV 1, normal saline IV fluid at 125 mL per hour   Hospital Course   Uncontrolled hypertension/hypertensive urgency - Presents with altered mental status, CT head with questionable PRES, MRI brain with no acute findings, (Incidental finding of C3-4 bulge/protrusion with slight flattening of the cord, was discussed with neurosurgery Dr. Mikal Plane, patient has no evidence of any neuropathy, quite likely inaccurate reading giving it was MRI brain and not MRI cervical spine). - Blood pressure control during hospital stay,  medicine regimen has been adjusted, will be discharged on amlodipine, irbesartan, and Bystolic, as her chlorothiazide stopped given hyponatremia. - This episode most likely due to Noncompliance with diet, educated about salt diet  Hyponatremia - Secondary to hydrochlorothiazide, improved after stopping it, discontinue hydrochlorothiazide on discharge  Diabetes mellitus - Poorly controlled, hemoglobin A1c is 7.9, her Janumet dose was increased, continue with Amaryl  Anemia - Iron deficiency anemia, B-12 deficiency anemia, started on supplements, instructed to follow regarding iron deficiency anemia workup was an outpatient, educated about importance of screening colonoscopy, son at bedside understands  Abnormal urinalysis - Negative urine culture, no indication for antibiotics on discharge  Discharge Condition:  Stable   Follow UP Family resting female ordered to PCP on discharge, was giving the information about the 2 PCPs on discharge to choose from and schedule an appointment with with they want.  Follow-up Information    Paruchuri, Janace Hoard, MD .   Specialty:  Internal Medicine Why:  Please make an appointment in one week Contact information: [address] [place] [address] [phone]        Marcellus Scott, MD .   Specialty:  Specialist Why:  Make an appointment with his physician if you choose to Contact information: [address] [place] [address] [phone]             Discharge Instructions  and  Discharge Medications     Discharge Instructions    Diet - low sodium heart healthy    Complete by:  As directed   Discharge instructions    Complete by:  As directed   Follow with Primary MD in 7 days , information was given to the family about speaking female physician in that area.  Get CBC, CMP,  checked  by Primary MD next visit.    Activity: As tolerated with Full fall precautions use walker/cane & assistance as  needed   Disposition Home    Diet: Heart Healthy , low-salt, carbohydrate modified. , with feeding assistance and aspiration precautions.  For Heart failure patients - Check your Weight same time everyday, if you gain over 2 pounds, or you develop in leg swelling, experience more shortness of breath or chest pain, call your Primary MD immediately. Follow Cardiac Low Salt Diet and 1.5 lit/day fluid restriction.   On your next visit with your primary care physician please Get Medicines reviewed and adjusted.   Please request your Prim.MD to go over all Hospital Tests and Procedure/Radiological results at the follow up, please get all Hospital records sent to your Prim MD by signing hospital release before you go home.   If you experience worsening of your admission symptoms, develop shortness of breath, life threatening emergency, suicidal or homicidal thoughts you must seek medical attention immediately by calling 911 or calling your MD immediately  if symptoms less severe.  You Must read complete instructions/literature along with all the possible adverse reactions/side effects for all the Medicines you take  and that have been prescribed to you. Take any new Medicines after you have completely understood and accpet all the possible adverse reactions/side effects.   Do not drive, operating heavy machinery, perform activities at heights, swimming or participation in water activities or provide baby sitting services if your were admitted for syncope or siezures until you have seen by Primary MD or a Neurologist and advised to do so again.  Do not drive when taking Pain medications.    Do not take more than prescribed Pain, Sleep and Anxiety Medications  Special Instructions: If you have smoked or chewed Tobacco  in the last 2 yrs please stop smoking, stop any regular Alcohol  and or any Recreational drug use.  Wear Seat belts while driving.   Please note  You were cared for by a  hospitalist during your hospital stay. If you have any questions about your discharge medications or the care you received while you were in the hospital after you are discharged, you can call the unit and asked to speak with the hospitalist on call if the hospitalist that took care of you is not available. Once you are discharged, your primary care physician will handle any further medical issues. Please note that NO REFILLS for any discharge medications will be authorized once you are discharged, as it is imperative that you return to your primary care physician (or establish a relationship with a primary care physician if you do not have one) for your aftercare needs so that they can reassess your need for medications and monitor your lab values.   Increase activity slowly    Complete by:  As directed       Medication List    STOP taking these medications   candesartan-hydrochlorothiazide 16-12.5 MG tablet Commonly known as:  ATACAND HCT Replaced by:  irbesartan 300 MG tablet   metFORMIN 500 MG tablet Commonly known as:  GLUCOPHAGE   sitaGLIPtin-metformin 50-500 MG tablet Commonly known as:  JANUMET Replaced by:  sitaGLIPtin-metformin 50-1000 MG tablet     TAKE these medications   AMARYL 2 MG tablet Generic drug:  glimepiride Take 2 mg by mouth 2 (two) times daily. Pt has medication that contains both Glimepiride 2mg  and Metformin 500 mg - Med known as AMARYL M S.R. Takes 1 tablet by mouth twice daily   amLODipine 5 MG tablet Commonly known as:  NORVASC Take 1 tablet (5 mg total) by mouth daily.   ANTI-ITCH MEDICATION EX Apply 1 application topically 3 times/day as needed-between meals & bedtime (ECZEMA).   atorvastatin 10 MG tablet Commonly known as:  LIPITOR Take 10 mg by mouth at bedtime. Pt has a med that contains Atorvastatin 10 mg and Ezetimibe 10 mg known as LIPIGET EZ   Calcium-Magnesium-Zinc-D3 Tabs Take 1 tablet by mouth at bedtime.   cyanocobalamin 100 MCG  tablet Take 1 tablet (100 mcg total) by mouth daily.   ezetimibe 10 MG tablet Commonly known as:  ZETIA Take 10 mg by mouth daily. Pt has a med that contains Atorvastatin 10 mg and Ezetimibe 10 mg known as LIPIGET EZ   ferrous sulfate 325 (65 FE) MG tablet Take 1 tablet (325 mg total) by mouth daily with breakfast.   irbesartan 300 MG tablet Commonly known as:  AVAPRO Take 1 tablet (300 mg total) by mouth daily. Replaces:  candesartan-hydrochlorothiazide 16-12.5 MG tablet   nebivolol 5 MG tablet Commonly known as:  BYSTOLIC Take 5 mg by mouth at bedtime.   pregabalin 50 MG  capsule Commonly known as:  LYRICA Take 50 mg by mouth at bedtime.   sitaGLIPtin-metformin 50-1000 MG tablet Commonly known as:  JANUMET Take 1 tablet by mouth 2 (two) times daily with a meal. Replaces:  sitaGLIPtin-metformin 50-500 MG tablet   THERAVITE PO Take 1 tablet by mouth every morning.         Diet and Activity recommendation: See Discharge Instructions above   Consults obtained -  none   Major procedures and Radiology Reports - PLEASE review detailed and final reports for all details, in brief -     Ct Head Wo Contrast  Result Date: 05/04/2016 CLINICAL DATA:  Biparietal headache and head pressure. Uncontrolled hypertension for 2 days. EXAM: CT HEAD WITHOUT CONTRAST TECHNIQUE: Contiguous axial images were obtained from the base of the skull through the vertex without intravenous contrast. COMPARISON:  None. FINDINGS: Brain: Questionable low-attenuation in the posterior occipital lobes bilaterally. No intracranial hemorrhage or midline shift. No hydrocephalus. The basilar cisterns are patent. No evidence of territorial infarct. No intracranial fluid collection. Vascular: No hyperdense vessel or abnormal calcification. Skull: Negative for fracture or focal lesion. Calvarium is intact. Sinuses/Orbits: No acute findings. Minimal bubbly debris in the sphenoid sinus. The mastoid air cells are well  aerated. Other: None. IMPRESSION: Equivocal diminished density involving the posterior occipital lobes in a symmetric pattern. Cannot exclude posterior reversible encephalopathy syndrome (PRES) in the setting of hypertension. If clinically warranted this could be further evaluated with MRI. No hemorrhage.  There is otherwise no acute abnormality. Electronically Signed   By: Rubye Oaks M.D.   On: 05/04/2016 01:29   Mr Laqueta Jean KV Contrast  Result Date: 05/04/2016 CLINICAL DATA:  60 year old diabetic hypertensive female with headaches when blood pressure is uncontrolled. Subsequent encounter. EXAM: MRI HEAD WITHOUT AND WITH CONTRAST TECHNIQUE: Multiplanar, multiecho pulse sequences of the brain and surrounding structures were obtained without and with intravenous contrast. CONTRAST:  14mL MULTIHANCE GADOBENATE DIMEGLUMINE 529 MG/ML IV SOLN COMPARISON:  05/04/2016 head CT. FINDINGS: No acute infarct or intracranial hemorrhage. No evidence of posterior reversible encephalopathy syndrome. No intracranial mass or abnormal enhancement. No age advanced atrophy or hydrocephalus. Major intracranial vascular structures are patent. Partially empty sella. Post right lens replacement. C3-4 bulge/protrusion with slight flattening of the cord. IMPRESSION: No acute infarct or intracranial hemorrhage. No evidence of posterior reversible encephalopathy syndrome. No intracranial mass or abnormal enhancement. Partially empty sella.  No other findings to suggest pseudotumor. C3-4 bulge/protrusion with slight flattening of the cord. Electronically Signed   By: Lacy Duverney M.D.   On: 05/04/2016 14:28    Micro Results     Recent Results (from the past 240 hour(s))  Urine culture     Status: None   Collection Time: 05/04/16 10:45 AM  Result Value Ref Range Status   Specimen Description URINE, RANDOM  Final   Special Requests NONE  Final   Culture NO GROWTH  Final   Report Status 05/05/2016 FINAL  Final        Today   Subjective:   Shalea Dietz today has no headache,no chest abdominal pain,no new weakness tingling or numbness, feels much better wants to go home today.   Objective:   Blood pressure 139/67, pulse 87, temperature 98.2 F (36.8 C), temperature source Oral, resp. rate 16, height  (1.6 m), weight 67 kg (147 lb 11.3 oz), SpO2 98 %.   Intake/Output Summary (Last 24 hours) at 05/05/16 1331 Last data filed at 05/05/16 1031  Gross per 24  hour  Intake              420 ml  Output                0 ml  Net              420 ml    Exam Awake Alert, Oriented x 3, No new F.N deficits, Normal affect [place].AT,PERRAL Supple Neck,No JVD, No cervical lymphadenopathy appriciated.  Symmetrical Chest wall movement, Good air movement bilaterally, CTAB RRR,No Gallops,Rubs or new Murmurs, No Parasternal Heave +ve B.Sounds, Abd Soft, Non tender, No organomegaly appriciated, No rebound -guarding or rigidity. No Cyanosis, Clubbing or edema, No new Rash or bruise  Data Review   CBC w Diff: Lab Results  Component Value Date   WBC 9.9 05/04/2016   HGB 10.8 (L) 05/04/2016   HCT 31.8 (L) 05/04/2016   PLT 387 05/04/2016   LYMPHOPCT 26 05/04/2016   MONOPCT 4 05/04/2016   EOSPCT 1 05/04/2016   BASOPCT 0 05/04/2016    CMP: Lab Results  Component Value Date   NA 134 (L) 05/05/2016   K 4.1 05/05/2016   CL 103 05/05/2016   CO2 22 05/05/2016   BUN 10 05/05/2016   CREATININE 0.61 05/05/2016  .   Total Time in preparing paper work, data evaluation and todays exam - 35 minutes  Bronsen Serano M.D on 05/05/2016 at 1:31 PM  [facility]   Office  [phone]

## 2016-05-05 NOTE — Progress Notes (Signed)
Patient was discharged home by MD order; discharged instructions review and give to patient and her son with care notes and prescriptions; IV DIC; skin intact; patient will be escorted to the car by nurse tech via wheelchair.  

## 2016-05-05 NOTE — Progress Notes (Signed)
Inpatient Diabetes Program Recommendations  AACE/ADA: New Consensus Statement on Inpatient Glycemic Control (2015)  Target Ranges:  Prepandial:   less than 140 mg/dL      Peak postprandial:   less than 180 mg/dL (1-2 hours)      Critically ill patients:  140 - 180 mg/dL   Lab Results  Component Value Date   GLUCAP 171 (H) 05/05/2016   HGBA1C 7.9 (H) 05/04/2016    Review of Glycemic Control  Results for Martha Webster, Martha Webster (MRN [id number]) as of 05/05/2016 12:11  Ref. Range 05/04/2016 08:23 05/04/2016 12:11 05/04/2016 17:17 05/04/2016 21:07 05/05/2016 08:18  Glucose-Capillary Latest Ref Range: 65 - 99 mg/dL 413134 (H) 244318 (H) 010316 (H) 311 (H) 171 (H)   Diabetes history: Type 2  Outpatient Diabetes medications: Amaryl 2mg  bid, Amaryl MSR (2mg  Glimeperide plus 500mg  Metformin ) bid, Janumet 50/500mg  1 qhs  Current orders for Inpatient glycemic control: Novolog sensitive correction scale 0-9 units tid, Novolog 0-5 units qhs, Tradgenta 5mg /day, Amaryl 2mg /day  Inpatient Diabetes Program Recommendations:  Please consider stopping the Amaryl while patient is inpatient.  Consider giving mealtime Novolog insulin 7 units tid while he is on steroids- continue Novolog correction as ordered.   Please consider ordering a carb modified vegetarian diet.   [name], RN, BA, MHA, CDE Diabetes Coordinator Inpatient Diabetes Program  [phone] (Team Pager) [phone] [facility] Office) 05/05/2016 12:16 PM

## 2017-07-02 IMAGING — CT CT HEAD W/O CM
3 series · 15 of 46 positions shown, 18 images · non-contrast
Comparison: None.

CLINICAL DATA: Biparietal headache and head pressure. Uncontrolled
hypertension for 2 days.

EXAM:
CT HEAD WITHOUT CONTRAST
TECHNIQUE: Contiguous axial images were obtained from the base of the skull
through the vertex without intravenous contrast.

[Series 2: head wo · axial · 0.43mm/px · z∈[-190,-70]mm · 9 of 29 slices shown, 12 images]
[im 3/29  brain]
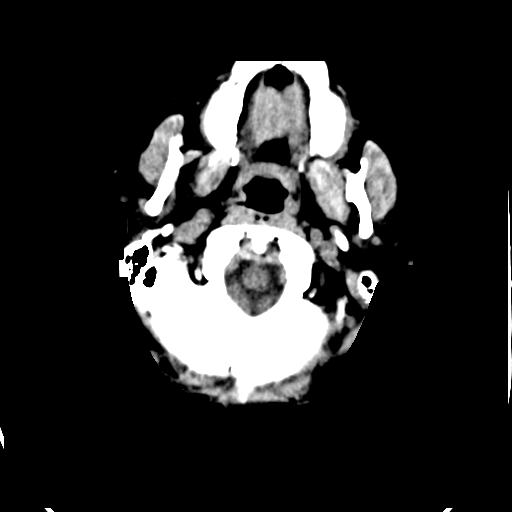
[im 3/29  bone]
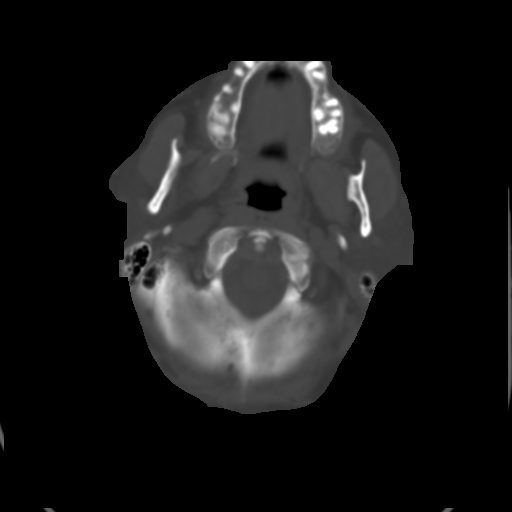
[im 6/29  brain]
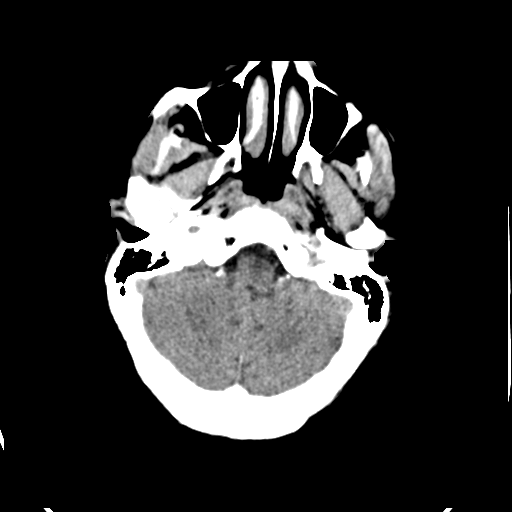
[im 9/29  brain]
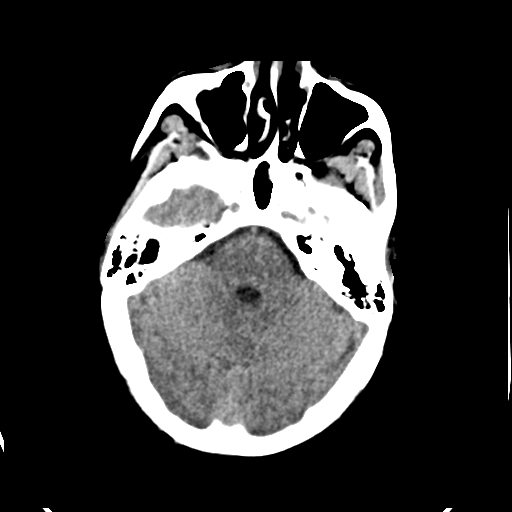
[im 12/29  brain]
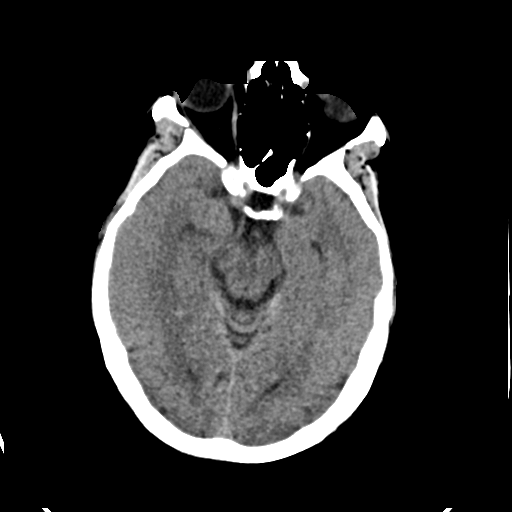
[im 15/29  brain]
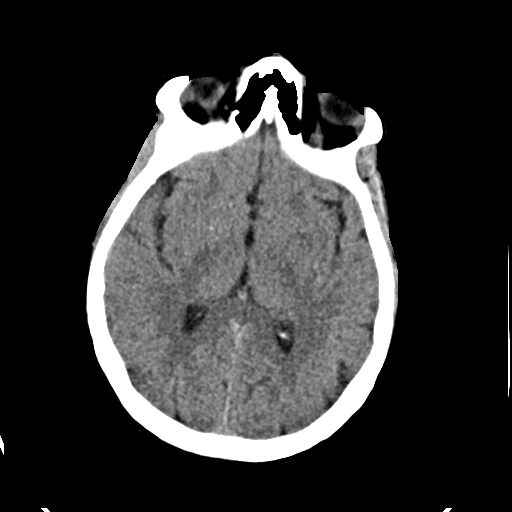
[im 15/29  bone]
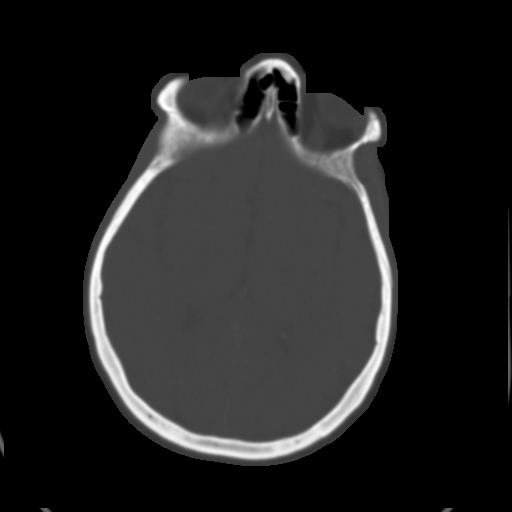
[im 18/29  brain]
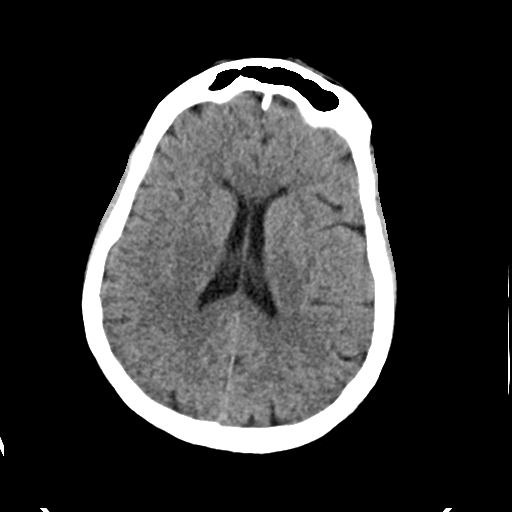
[im 21/29  brain]
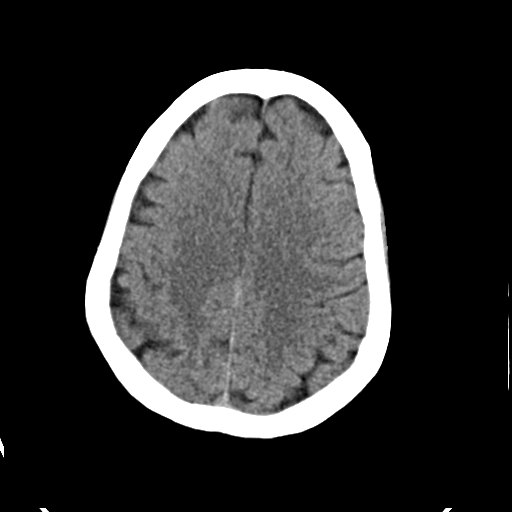
[im 24/29  brain]
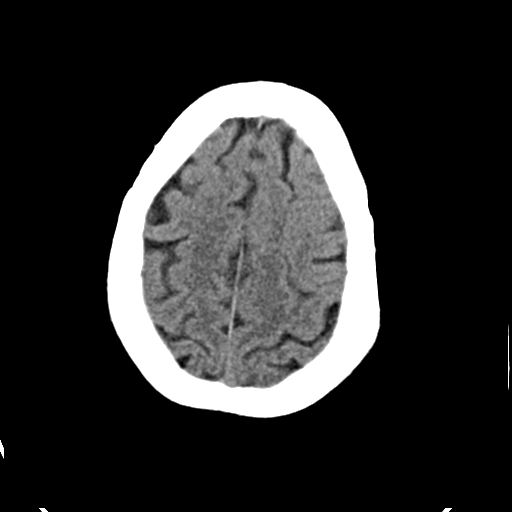
[im 27/29  brain]
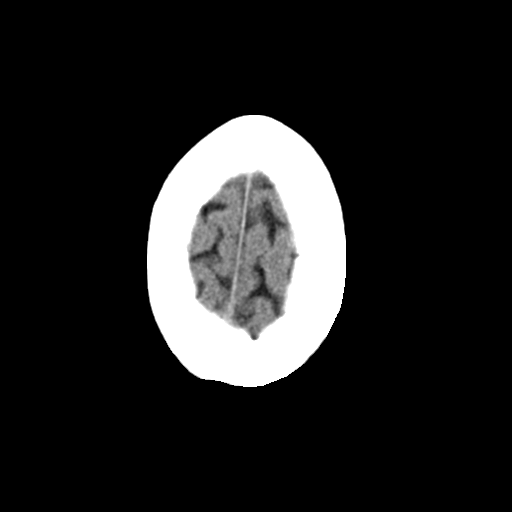
[im 27/29  bone]
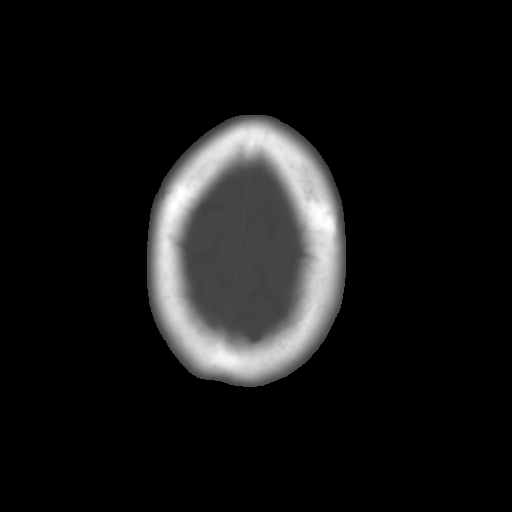

[Series 4: coronal soft · coronal · 0.30mm/px · 3 of 63 slices shown]
[im 21/63  brain]
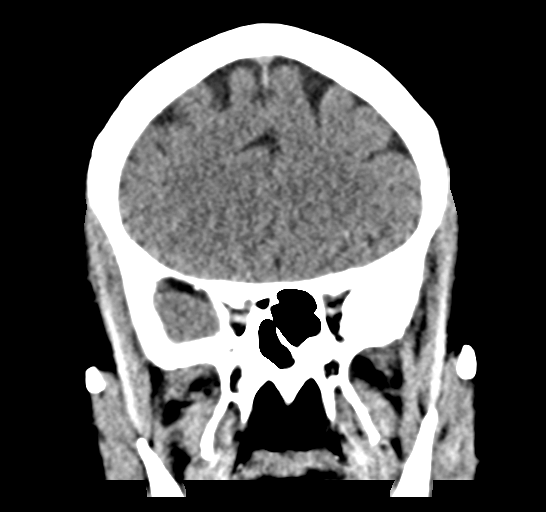
[im 28/63  brain]
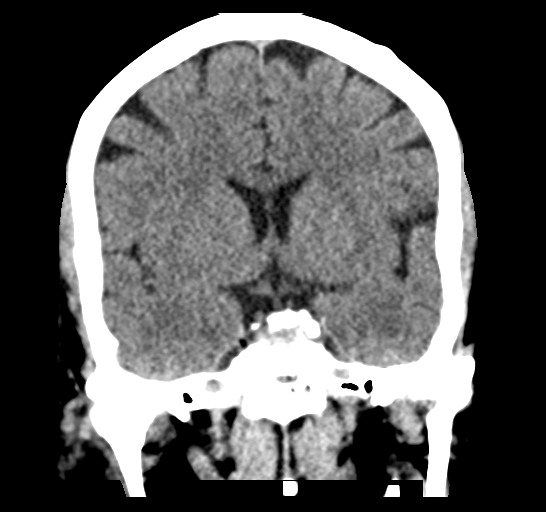
[im 35/63  brain]
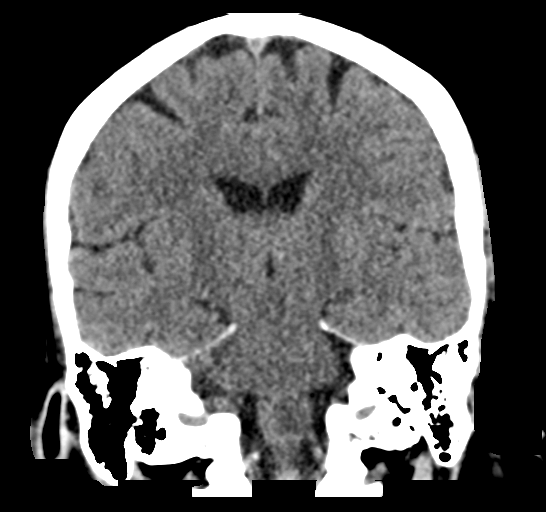

[Series 5: sag soft · sagittal · 0.30mm/px · 3 of 39 slices shown]
[im 13/39  brain]
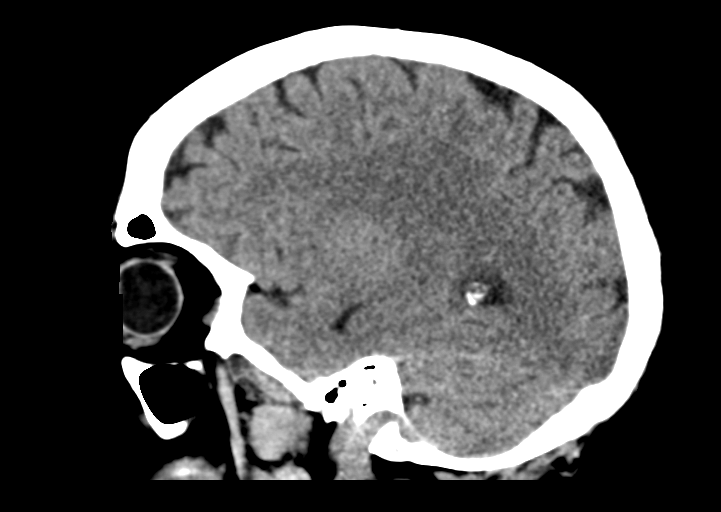
[im 20/39  brain]
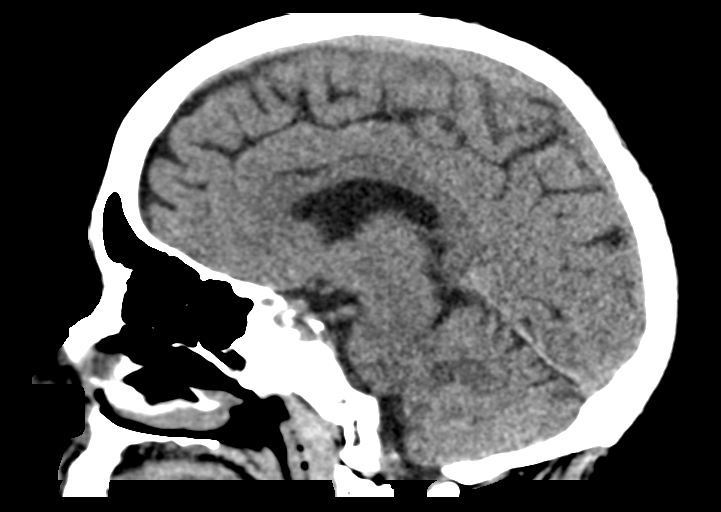
[im 26/39  brain]
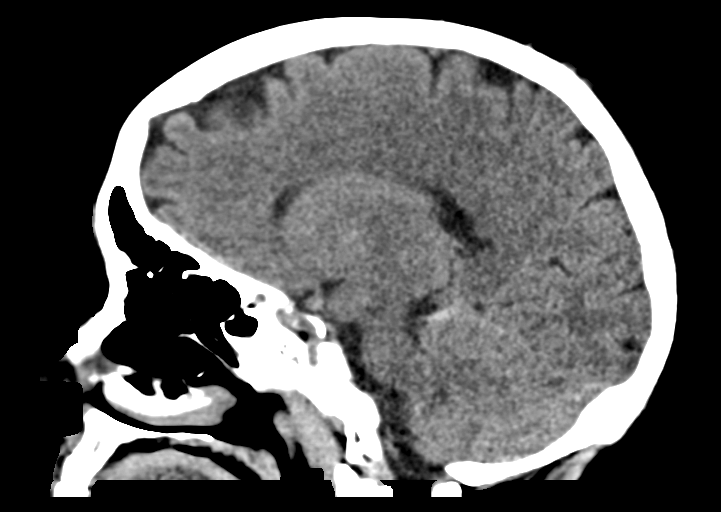

[15 of 46 positions shown; findings below may reference images not displayed]

FINDINGS: Brain: Questionable low-attenuation in the posterior occipital lobes
bilaterally. No intracranial hemorrhage or midline shift. No
hydrocephalus. The basilar cisterns are patent. No evidence of
territorial infarct. No intracranial fluid collection.

Vascular: No hyperdense vessel or abnormal calcification.

Skull: Negative for fracture or focal lesion. Calvarium is intact.

Sinuses/Orbits: No acute findings. Minimal bubbly debris in the
sphenoid sinus. The mastoid air cells are well aerated.

Other: None.
IMPRESSION: Equivocal diminished density involving the posterior occipital lobes
in a symmetric pattern. Cannot exclude posterior reversible
encephalopathy syndrome (PRES) in the setting of hypertension. If
clinically warranted this could be further evaluated with MRI.

No hemorrhage.  There is otherwise no acute abnormality.
